# Patient Record
Sex: Male | Born: 1975 | Race: Black or African American | Hispanic: No | Marital: Single | Smoking: Current every day smoker
Health system: Southern US, Community
[De-identification: ages and names within clinical notes are randomized; demographics above are authoritative.]

## PROBLEM LIST (undated history)

## (undated) DIAGNOSIS — C069 Malignant neoplasm of mouth, unspecified: Secondary | ICD-10-CM

## (undated) DIAGNOSIS — B977 Papillomavirus as the cause of diseases classified elsewhere: Secondary | ICD-10-CM

## (undated) HISTORY — PX: MOUTH SURGERY: SHX715

---

## 2019-05-03 ENCOUNTER — Emergency Department (HOSPITAL_BASED_OUTPATIENT_CLINIC_OR_DEPARTMENT_OTHER): Payer: PRIVATE HEALTH INSURANCE

## 2019-05-03 ENCOUNTER — Encounter (HOSPITAL_BASED_OUTPATIENT_CLINIC_OR_DEPARTMENT_OTHER): Payer: Self-pay

## 2019-05-03 ENCOUNTER — Emergency Department (HOSPITAL_BASED_OUTPATIENT_CLINIC_OR_DEPARTMENT_OTHER)
Admission: EM | Admit: 2019-05-03 | Discharge: 2019-05-03 | Disposition: A | Discharge status: Home / Self Care | Payer: PRIVATE HEALTH INSURANCE | Attending: Emergency Medicine | Admitting: Emergency Medicine

## 2019-05-03 ENCOUNTER — Other Ambulatory Visit: Payer: Self-pay

## 2019-05-03 DIAGNOSIS — R1031 Right lower quadrant pain: Secondary | ICD-10-CM | POA: Diagnosis present

## 2019-05-03 DIAGNOSIS — Z880 Allergy status to penicillin: Secondary | ICD-10-CM | POA: Insufficient documentation

## 2019-05-03 DIAGNOSIS — F17210 Nicotine dependence, cigarettes, uncomplicated: Secondary | ICD-10-CM | POA: Insufficient documentation

## 2019-05-03 DIAGNOSIS — R1084 Generalized abdominal pain: Secondary | ICD-10-CM | POA: Diagnosis not present

## 2019-05-03 HISTORY — DX: Malignant neoplasm of mouth, unspecified: C06.9

## 2019-05-03 HISTORY — DX: Papillomavirus as the cause of diseases classified elsewhere: B97.7

## 2019-05-03 LAB — URINALYSIS, ROUTINE W REFLEX MICROSCOPIC
Bilirubin Urine: NEGATIVE
Glucose, UA: NEGATIVE mg/dL
Ketones, ur: 15 mg/dL — AB
Leukocytes,Ua: NEGATIVE
Nitrite: NEGATIVE
Protein, ur: NEGATIVE mg/dL
Specific Gravity, Urine: >1.03 — ABNORMAL HIGH (ref 1.005–1.030)
pH: 6 (ref 5.0–8.0)

## 2019-05-03 LAB — COMPREHENSIVE METABOLIC PANEL
ALT: 28 U/L (ref 0–44)
AST: 25 U/L (ref 15–41)
Albumin: 4.4 g/dL (ref 3.5–5.0)
Alkaline Phosphatase: 61 U/L (ref 38–126)
Anion gap: 9 (ref 5–15)
BUN: 7 mg/dL (ref 6–20)
CO2: 24 mmol/L (ref 22–32)
Calcium: 9.5 mg/dL (ref 8.9–10.3)
Chloride: 100 mmol/L (ref 98–111)
Creatinine, Ser: 1.09 mg/dL (ref 0.61–1.24)
GFR calc Af Amer: >60 mL/min (ref >60)
GFR calc non Af Amer: >60 mL/min (ref >60)
Glucose, Bld: 105 mg/dL — ABNORMAL HIGH (ref 70–99)
Potassium: 3.9 mmol/L (ref 3.5–5.1)
Sodium: 133 mmol/L — ABNORMAL LOW (ref 135–145)
Total Bilirubin: 0.5 mg/dL (ref 0.3–1.2)
Total Protein: 8.4 g/dL — ABNORMAL HIGH (ref 6.5–8.1)

## 2019-05-03 LAB — CBC
HCT: 48.2 % (ref 39.0–52.0)
Hemoglobin: 16.6 g/dL (ref 13.0–17.0)
MCH: 32.2 pg (ref 26.0–34.0)
MCHC: 34.4 g/dL (ref 30.0–36.0)
MCV: 93.6 fL (ref 80.0–100.0)
Platelets: 290 10*3/uL (ref 150–400)
RBC: 5.15 MIL/uL (ref 4.22–5.81)
RDW: 13.2 % (ref 11.5–15.5)
WBC: 10.9 10*3/uL — ABNORMAL HIGH (ref 4.0–10.5)
nRBC: 0 % (ref 0.0–0.2)

## 2019-05-03 LAB — URINALYSIS, MICROSCOPIC (REFLEX): WBC, UA: NONE SEEN WBC/hpf (ref 0–5)

## 2019-05-03 LAB — LIPASE, BLOOD: Lipase: 24 U/L (ref 11–51)

## 2019-05-03 MED ORDER — KETOROLAC TROMETHAMINE 30 MG/ML IJ SOLN
30.0000 mg | Freq: Once | INTRAMUSCULAR | Status: AC
  Administered 2019-05-03: 22:00:00 30 mg via INTRAVENOUS
  Filled 2019-05-03: qty 1

## 2019-05-03 MED ORDER — SODIUM CHLORIDE 0.9% FLUSH
3.0000 mL | Freq: Once | INTRAVENOUS | Status: DC
  Filled 2019-05-03: qty 3

## 2019-05-03 MED ORDER — ONDANSETRON 4 MG PO TBDP: 4.0000 mg | ORAL_TABLET | Freq: Three times a day (TID) | ORAL | 0 refills | Status: AC | PRN

## 2019-05-03 MED ORDER — SODIUM CHLORIDE 0.9 % IV BOLUS
1000.0000 mL | Freq: Once | INTRAVENOUS | Status: AC
  Administered 2019-05-03: 1000 mL via INTRAVENOUS

## 2019-05-03 MED ORDER — IOHEXOL 300 MG/ML  SOLN
100.0000 mL | Freq: Once | INTRAMUSCULAR | Status: AC | PRN
  Administered 2019-05-03: 100 mL via INTRAVENOUS

## 2019-05-03 MED ORDER — METOCLOPRAMIDE HCL 5 MG/ML IJ SOLN
10.0000 mg | Freq: Once | INTRAMUSCULAR | Status: AC
  Administered 2019-05-03: 10 mg via INTRAVENOUS
  Filled 2019-05-03: qty 2

## 2019-05-03 MED ORDER — IBUPROFEN 800 MG PO TABS: 800.0000 mg | ORAL_TABLET | Freq: Three times a day (TID) | ORAL | 0 refills | Status: AC

## 2019-05-03 NOTE — ED Notes (Signed)
Warm blanket given

## 2019-05-03 NOTE — ED Provider Notes (Signed)
[place] EMER[facility]ENCY DEPARTMENT Provider Note   CSN: [id number] Arrival date & time: 05/03/19  1817     History Chief Complaint  Patient presents with  . Abdominal Pain    Casey Frye is a 44 y.o. male with PMH of HPV-related oral cancer and remote history of kidney stones who presents to the ED with acute onset right-sided abdominal pain.  Patient reports that his pain woke him up from his sleep yesterday morning at approximately 5:30 AM and at times is 10 out of 10 discomfort.  It waxes and wanes with the only alleviating factor being mild relief with lying down.  He has not been able to eat anything today due to his pain symptoms and concern that he may not be able to tolerate it well.  He has been drinking minimally and feels as though he will throw up any additional fluids.  He also has been endorsing chills, but denies any fevers.  He has never had anything like this happen before and it does not feel like his previous kidney stones.  He denies any recent illness, headache or dizziness, fevers, chest pain or difficulty breathing, vomiting, urinary symptoms, hematochezia, melena, or other changes in bowel habits.  His last BM was this morning and was soft, brown.  HPI     Past Medical History:  Diagnosis Date  . 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   Past Surgical History:  Procedure Laterality Date  . MOUTH SURGERY         No family history on file.  Social History   Tobacco Use  . Smoking status: Current Every Day Smoker    Types: Cigars  . Smokeless tobacco: Never Used  Substance Use Topics  . Alcohol use: Yes    Comment: occ  . Drug use: Yes    Types: Marijuana    Home Medications Prior to Admission medications   Medication Sig Start Date End Date Taking? Authorizing Provider  ibuprofen (ADVIL) 800 MG tablet Take 1 tablet (800 mg total) by mouth 3 (three) times daily. 05/03/19    Corena Herter, PA-C  ondansetron (ZOFRAN ODT) 4 MG disintegrating tablet Take 1 tablet (4 mg total) by mouth every 8 (eight) hours as needed for nausea or vomiting. 05/03/19   Corena Herter, PA-C    Allergies    Other and Penicillins  Review of Systems   Review of Systems  Constitutional: Positive for appetite change and chills. Negative for fever.  Respiratory: Negative for shortness of breath.   Cardiovascular: Negative for chest pain.  Gastrointestinal: Positive for abdominal pain. Negative for constipation, diarrhea, nausea and vomiting.    Physical Exam Updated Vital Signs BP (!) 148/95 (BP Location: Right Arm)   Pulse 61   Temp 98.4 F (36.9 C) (Oral)   Resp 16   Ht 5\' 11"  (1.803 m)   Wt 68.9 kg   SpO2 100%   BMI 21.20 kg/m   Physical Exam Vitals and nursing note reviewed. Exam conducted with a chaperone present.  Constitutional:      General: He is not in acute distress.    Appearance: Normal appearance. He is not ill-appearing.  HENT:     Head: Normocephalic and atraumatic.  Eyes:     General: No scleral icterus.    Conjunctiva/sclera: Conjunctivae normal.  Cardiovascular:     Rate and Rhythm: Normal rate and regular rhythm.  Pulses: Normal pulses.     Heart sounds: Normal heart sounds.  Pulmonary:     Effort: Pulmonary effort is normal. No respiratory distress.     Breath sounds: Normal breath sounds.  Abdominal:     Comments: Soft, nondistended.  TTP along right side of abdomen with difficulty tolerating exam.  No TTP elsewhere.  Cannot reliably assess Percell Miller sign or McBurney's point tenderness given his involuntary guarding.  No overlying skin changes.  Normoactive bowel sounds.  Scar obliquely through middle of abdomen with mild keloid formation.  Musculoskeletal:     Cervical back: Normal range of motion. No rigidity.  Skin:    General: Skin is dry.     Capillary Refill: Capillary refill takes less than 2 seconds.  Neurological:     Mental  Status: He is alert and oriented to person, place, and time.     GCS: GCS eye subscore is 4. GCS verbal subscore is 5. GCS motor subscore is 6.  Psychiatric:        Mood and Affect: Mood normal.        Behavior: Behavior normal.        Thought Content: Thought content normal.     ED Results / Procedures / Treatments   Labs (all labs ordered are listed, but only abnormal results are displayed) Labs Reviewed  COMPREHENSIVE METABOLIC PANEL - Abnormal; Notable for the following components:      Result Value   Sodium 133 (*)    Glucose, Bld 105 (*)    Total Protein 8.4 (*)    All other components within normal limits  CBC - Abnormal; Notable for the following components:   WBC 10.9 (*)    All other components within normal limits  URINALYSIS, ROUTINE W REFLEX MICROSCOPIC - Abnormal; Notable for the following components:   Specific Gravity, Urine >1.030 (*)    Hgb urine dipstick SMALL (*)    Ketones, ur 15 (*)    All other components within normal limits  URINALYSIS, MICROSCOPIC (REFLEX) - Abnormal; Notable for the following components:   Bacteria, UA RARE (*)    All other components within normal limits  LIPASE, BLOOD    EKG None  Radiology CT ABDOMEN PELVIS W CONTRAST  Result Date: 05/03/2019 CLINICAL DATA:  Acute generalized abdominal pain with neutropenia EXAM: CT ABDOMEN AND PELVIS WITH CONTRAST TECHNIQUE: Multidetector CT imaging of the abdomen and pelvis was performed using the standard protocol following bolus administration of intravenous contrast. CONTRAST:  125mL OMNIPAQUE IOHEXOL 300 MG/ML  SOLN COMPARISON:  None. FINDINGS: Lower chest: Atelectatic changes in the otherwise clear lung bases. Normal heart size. No pericardial effusion. Hepatobiliary: Focal fatty infiltration along the falciform ligament. No worrisome focal liver lesions. Smooth liver surface contour. Normal hepatic attenuation. Normal gallbladder. No biliary ductal dilatation or visible calcified  gallstones. Pancreas: Unremarkable. No pancreatic ductal dilatation or surrounding inflammatory changes. Spleen: Normal in size without focal abnormality. Adrenals/Urinary Tract: Normal adrenal glands. Few scattered subcentimeter hypoattenuating foci in both kidneys too small to fully characterize on CT imaging but statistically likely benign. Kidneys enhance and excrete symmetrically. No worrisome renal lesion, urolithiasis or hydronephrosis. Urinary bladder is largely decompressed at the time of exam and therefore poorly evaluated by CT imaging. Mild circumferential bladder wall thickening greater than expected for the degree of underdistention. A Stomach/Bowel: Small hiatal hernia. Distal stomach and duodenum are unremarkable. Duodenal sweep takes a normal course across the midline abdomen. No small bowel dilatation or wall thickening. Normal appendix seen  in the right lower quadrant. Moderate volume of air and stool throughout the colon. No colonic dilatation or wall thickening. Vascular/Lymphatic: Atherosclerotic plaque within the normal caliber aorta. No suspicious or enlarged lymph nodes in the included lymphatic chains. Reproductive: The prostate and seminal vesicles are unremarkable. Other: Some trace low-attenuation free fluid is noted in the deep pelvis (2/61). Finding is nonspecific. No bowel containing hernias. Small fat containing right inguinal hernia. Mild ventral diastasis recti. No free abdominopelvic air Musculoskeletal: No acute osseous abnormality or suspicious osseous lesion. Minimal degenerative changes noted in the hips with a small right os acetabuli. IMPRESSION: 1. Mild circumferential bladder wall thickening greater than expected for the degree of underdistention. Recommend correlation with urinalysis to exclude cystitis. 2. Trace low-attenuation free fluid in the deep pelvis is nonspecific and may be physiologic or reactive in etiology. 3. No other acute abdominopelvic process to provide  cause for patient's symptoms, specifically normal appendix. 4. Small hiatal hernia. 5. Aortic Atherosclerosis (ICD10-I70.0). Electronically Signed   By: Lovena Le M.D.   On: 05/03/2019 21:02    Procedures Procedures (including critical care time)  Medications Ordered in ED Medications  sodium chloride flush (NS) 0.9 % injection 3 mL (3 mLs Intravenous Not Given 05/03/19 2053)  sodium chloride 0.9 % bolus 1,000 mL ( Intravenous Stopped 05/03/19 2209)  iohexol (OMNIPAQUE) 300 MG/ML solution 100 mL (100 mLs Intravenous Contrast Given 05/03/19 2048)  ketorolac (TORADOL) 30 MG/ML injection 30 mg (30 mg Intravenous Given 05/03/19 2142)  metoCLOPramide (REGLAN) injection 10 mg (10 mg Intravenous Given 05/03/19 2142)    ED Course  I have reviewed the triage vital signs and the nursing notes.  Pertinent labs & imaging results that were available during my care of the patient were reviewed by me and considered in my medical decision making (see chart for details).    MDM Rules/Calculators/A&P                      Patient was mildly hyponatremic to 133 and UA was significant for elevated specific gravity and ketones concerning for dehydration.  This is consistent with patient's report that he has had diminished appetite and has not been eating or drinking since onset of his right-sided abdominal discomfort.  Patient has mild leukocytosis to 10.9, but otherwise the remainder of his laboratory work-up is unremarkable.  Obtained CT abdomen pelvis with contrast given his significant tenderness on physical exam in conjunction with his acute onset abdominal pain.  I personally reviewed the imaging which demonstrates no evidence of appendicitis, cholecystitis, free air, or other emergent intra-abdominal pathology.  However there is mild air in the colon, albeit without any colonic wall thickening.  While patient reports that his stools have been normal, encouraging him to increase his fiber intake and  hydration.  Also encouraging over-the-counter laxative to facilitate passage of stool to see if that improves his symptoms of pain.  Otherwise, he can take Tylenol ibuprofen for his symptoms.  This patient presents with abdominal pain of unclear etiology. A CT scan was performed to evaluate for potential causes of the abdominal pain, however, neither the clinical exam nor the CT has identified an emergent etiology for the abdominal pain. Specifically, given the benign exam, the laboratory studies, and unremarkable CT, I have a very low suspicion for appendicitis, ischemic bowel, bowel perforation, or any other life threatening disease. I have discussed with the patient the level of uncertainty with undifferentiated abdominal pain and clearly explained the need to  follow-up as noted on the discharge instructions, or return to the Emergency Department immediately if the pain worsens, develops fever, persistent and uncontrollable vomiting, or for any new symptoms or concerns.  On reevaluation, patient felt improved after Toradol and Reglan.  Will refer to gastroenterology should patient continue experience ongoing abdominal pain despite conservative management.  Patient tolerated p.o. challenge here in the ED without difficulty.  Strict ED return precautions discussed.  All of the evaluation and work-up results were discussed with the patient and any family at bedside. They were provided opportunity to ask any additional questions and have none at this time. They have expressed understanding of verbal discharge instructions as well as return precautions and are agreeable to the plan.   Final Clinical Impression(s) / ED Diagnoses Final diagnoses:  Generalized abdominal pain    Rx / DC Orders ED Discharge Orders         Ordered    ondansetron (ZOFRAN ODT) 4 MG disintegrating tablet  Every 8 hours PRN     05/03/19 2235    ibuprofen (ADVIL) 800 MG tablet  3 times daily     05/03/19 2235             Corena Herter, PA-C 05/03/19 2236    Lucrezia Starch, MD 05/04/19 1506

## 2019-05-03 NOTE — Discharge Instructions (Signed)
Recommending increased fiber, increased oral hydration, and MiraLAX regimen to facilitate bowel movements in effort to resolve your discomfort.  You may take Tylenol or Ibuprofen, as needed.  Please call [facility] to schedule an appointment for ongoing evaluation and management should your symptoms fail to improve with conservative therapy.    Return to the ED or seek immediate medical attention should experience any new or worsening symptoms.

## 2019-05-03 NOTE — ED Notes (Signed)
PT tolerated po water, no emesis.

## 2019-05-03 NOTE — ED Triage Notes (Signed)
Pt c/o right side abd pain started yesterday-denies n/v/d-NAD-steady gait

## 2021-06-11 IMAGING — CT CT ABD-PELV W/ CM
2 of 5 series · 15 of 46 positions shown, 17 images · IV contrast (omnipaque)
Comparison: None.

CLINICAL DATA: Acute generalized abdominal pain with neutropenia

EXAM:
CT ABDOMEN AND PELVIS WITH CONTRAST
TECHNIQUE: Multidetector CT imaging of the abdomen and pelvis was performed
using the standard protocol following bolus administration of
intravenous contrast.
CONTRAST:  100mL OMNIPAQUE IOHEXOL 300 MG/ML  SOLN

[Series 2: axial st · axial · 0.65mm/px · z∈[+826,+1191]mm · 12 of 83 slices shown, 14 images]
[im 5/83  soft-tissue]
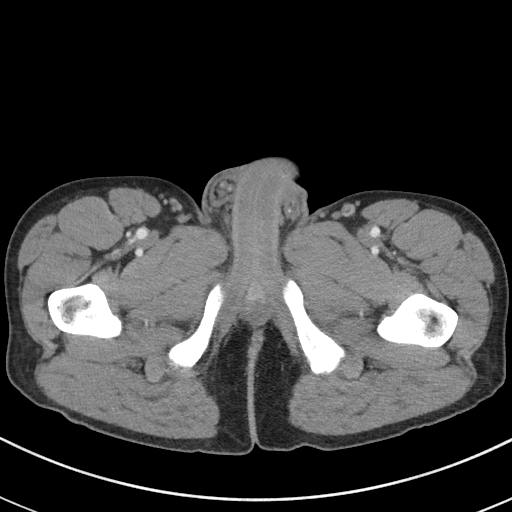
[im 5/83  bone]
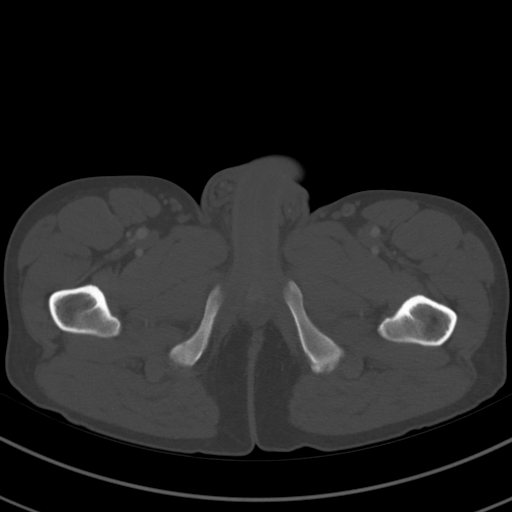
[im 13/83  soft-tissue]
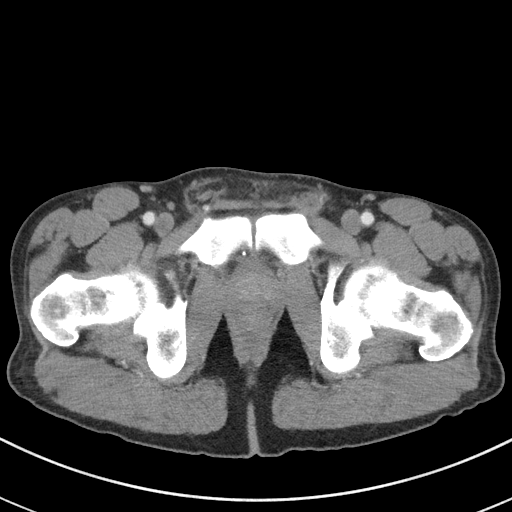
[im 18/83  soft-tissue]
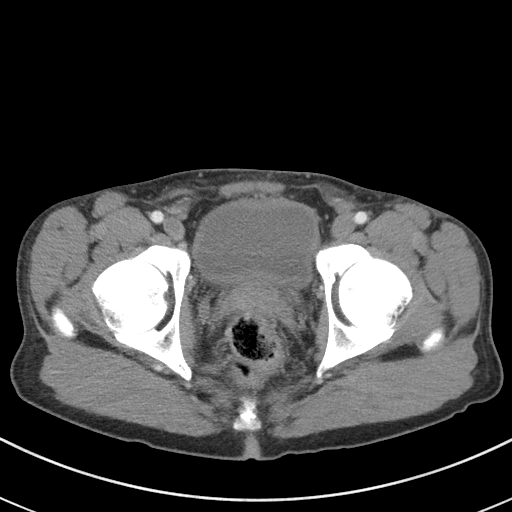
[im 26/83  soft-tissue]
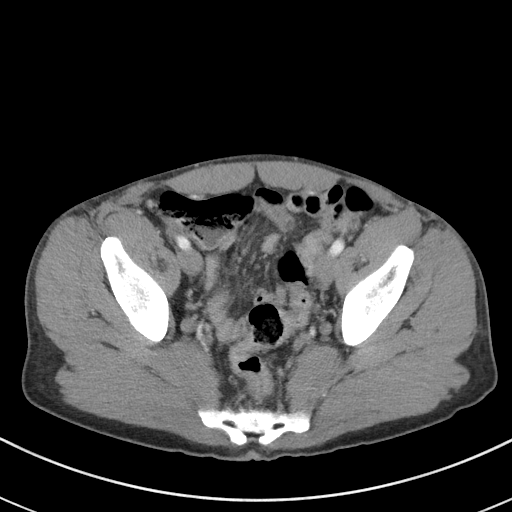
[im 31/83  soft-tissue]
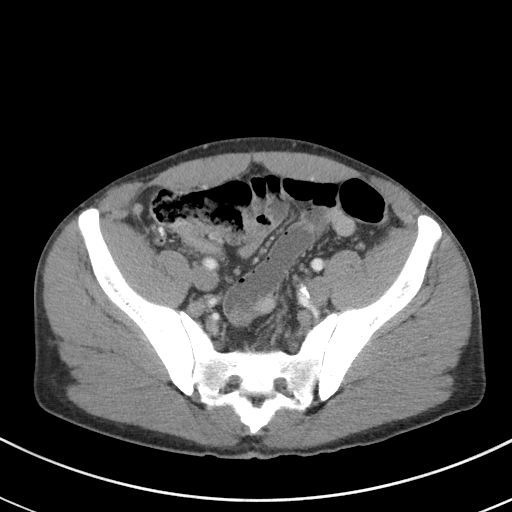
[im 39/83  soft-tissue]
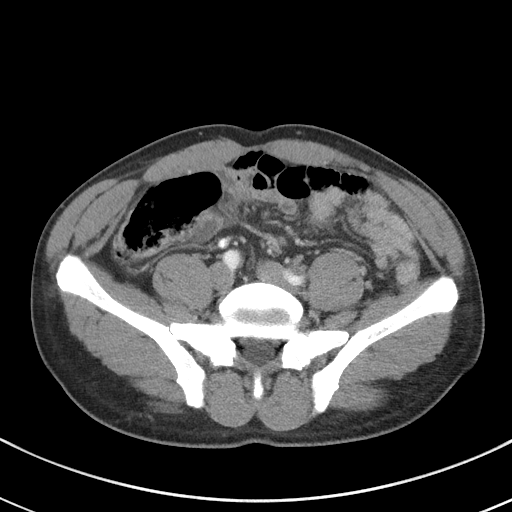
[im 44/83  soft-tissue]
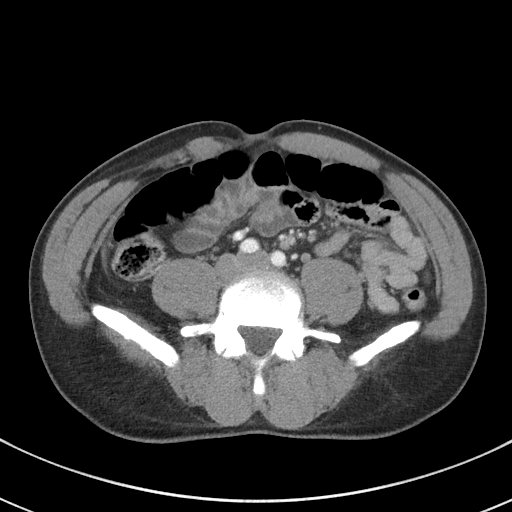
[im 52/83  soft-tissue]
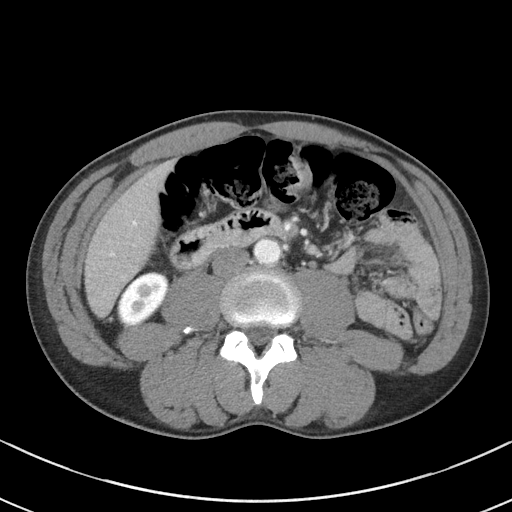
[im 57/83  soft-tissue]
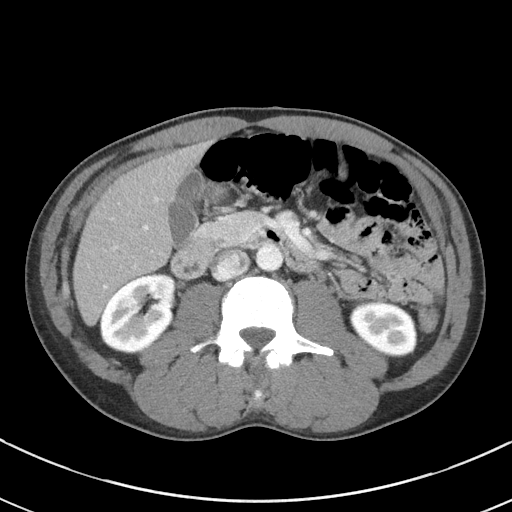
[im 57/83  bone]
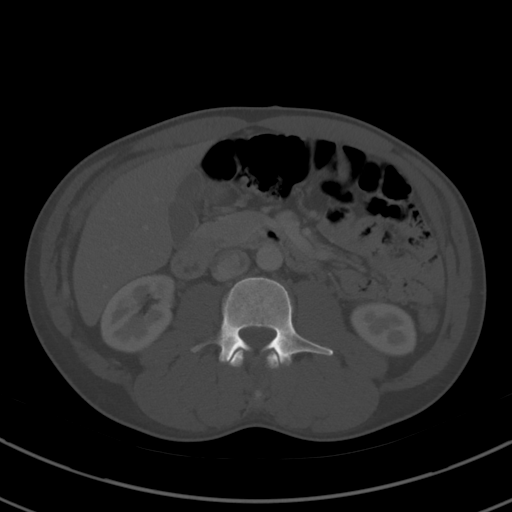
[im 65/83  soft-tissue]
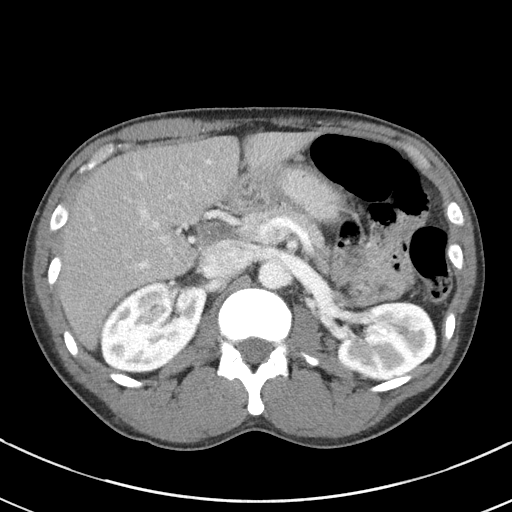
[im 70/83  soft-tissue]
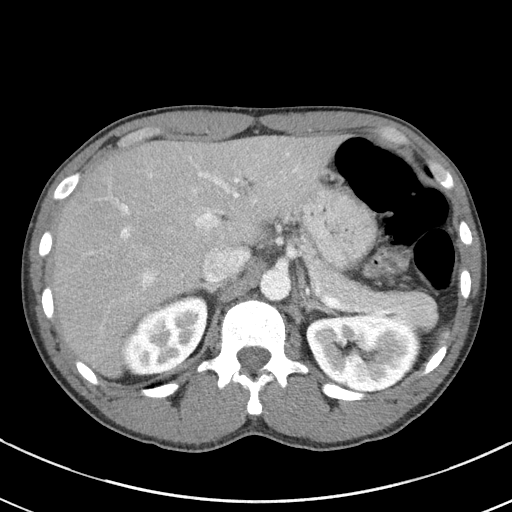
[im 78/83  soft-tissue]
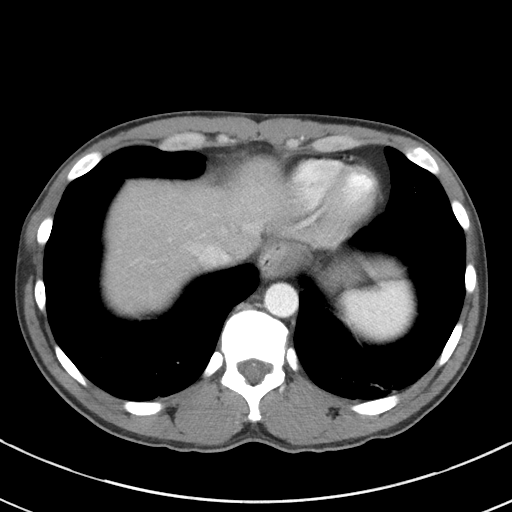

[Series 5: coronal st · coronal · 0.67mm/px · 3 of 85 slices shown]
[im 29/85  soft-tissue]
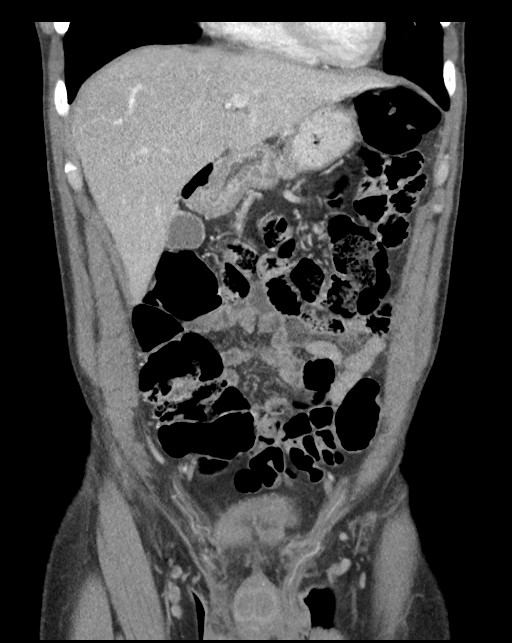
[im 38/85  soft-tissue]
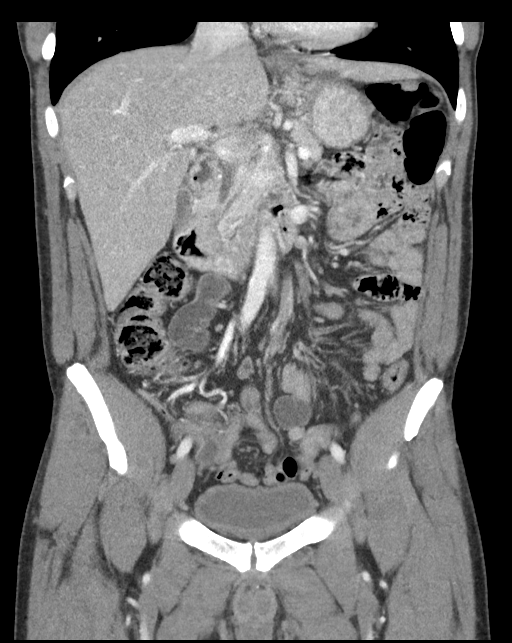
[im 47/85  soft-tissue]
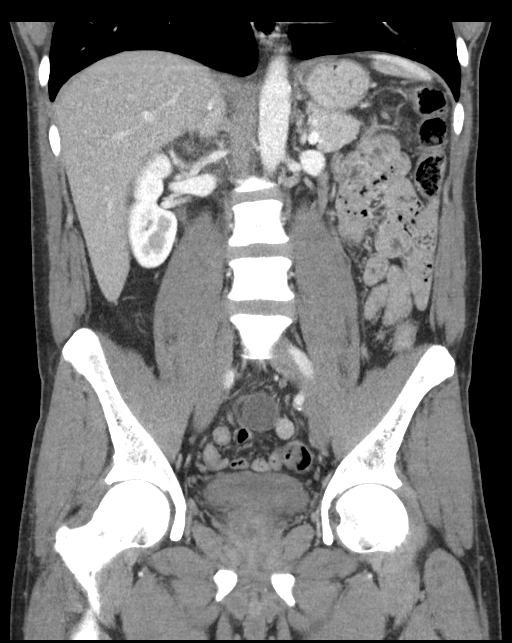

[15 of 46 positions shown; findings below may reference images not displayed]

FINDINGS: Lower chest: Atelectatic changes in the otherwise clear lung bases.
Normal heart size. No pericardial effusion.

Hepatobiliary: Focal fatty infiltration along the falciform
ligament. No worrisome focal liver lesions. Smooth liver surface
contour. Normal hepatic attenuation. Normal gallbladder. No biliary
ductal dilatation or visible calcified gallstones.

Pancreas: Unremarkable. No pancreatic ductal dilatation or
surrounding inflammatory changes.

Spleen: Normal in size without focal abnormality.

Adrenals/Urinary Tract: Normal adrenal glands. Few scattered
subcentimeter hypoattenuating foci in both kidneys too small to
fully characterize on CT imaging but statistically likely benign.
Kidneys enhance and excrete symmetrically. No worrisome renal
lesion, urolithiasis or hydronephrosis. Urinary bladder is largely
decompressed at the time of exam and therefore poorly evaluated by
CT imaging. Mild circumferential bladder wall thickening greater
than expected for the degree of underdistention. A

Stomach/Bowel: Small hiatal hernia. Distal stomach and duodenum are
unremarkable. Duodenal sweep takes a normal course across the
midline abdomen. No small bowel dilatation or wall thickening.
Normal appendix seen in the right lower quadrant. Moderate volume of
air and stool throughout the colon. No colonic dilatation or wall
thickening.

Vascular/Lymphatic: Atherosclerotic plaque within the normal caliber
aorta. No suspicious or enlarged lymph nodes in the included
lymphatic chains.

Reproductive: The prostate and seminal vesicles are unremarkable.

Other: Some trace low-attenuation free fluid is noted in the deep
pelvis ([DATE]). Finding is nonspecific. No bowel containing hernias.
Small fat containing right inguinal hernia. Mild ventral diastasis
recti. No free abdominopelvic air

Musculoskeletal: No acute osseous abnormality or suspicious osseous
lesion. Minimal degenerative changes noted in the hips with a small
right os acetabuli.
IMPRESSION: 1. Mild circumferential bladder wall thickening greater than
expected for the degree of underdistention. Recommend correlation
with urinalysis to exclude cystitis.
2. Trace low-attenuation free fluid in the deep pelvis is
nonspecific and may be physiologic or reactive in etiology.
3. No other acute abdominopelvic process to provide cause for
patient's symptoms, specifically normal appendix.
4. Small hiatal hernia.
5. Aortic Atherosclerosis ([id number]).
